# Patient Record
Sex: Female | Born: 1995 | Race: Black or African American | Hispanic: No | Marital: Single | Smoking: Never smoker
Health system: Southern US, Community
[De-identification: ages and names within clinical notes are randomized; demographics above are authoritative.]

## PROBLEM LIST (undated history)

## (undated) DIAGNOSIS — R51 Headache: Secondary | ICD-10-CM

## (undated) DIAGNOSIS — R519 Headache, unspecified: Secondary | ICD-10-CM

## (undated) DIAGNOSIS — D649 Anemia, unspecified: Secondary | ICD-10-CM

## (undated) DIAGNOSIS — M419 Scoliosis, unspecified: Secondary | ICD-10-CM

## (undated) DIAGNOSIS — Z789 Other specified health status: Secondary | ICD-10-CM

## (undated) HISTORY — PX: NO PAST SURGERIES: SHX2092

## (undated) HISTORY — PX: WISDOM TOOTH EXTRACTION: SHX21

---
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
## 2014-10-08 ENCOUNTER — Encounter (HOSPITAL_COMMUNITY): Payer: Self-pay | Admitting: *Deleted

## 2014-10-08 ENCOUNTER — Other Ambulatory Visit: Payer: Self-pay | Admitting: Obstetrics & Gynecology

## 2014-10-09 ENCOUNTER — Ambulatory Visit (HOSPITAL_COMMUNITY): Payer: 59

## 2014-10-09 ENCOUNTER — Encounter (HOSPITAL_COMMUNITY)
Admission: RE | Disposition: A | Discharge status: Home / Self Care | Payer: Self-pay | Source: Ambulatory Visit | Attending: Obstetrics & Gynecology

## 2014-10-09 ENCOUNTER — Ambulatory Visit (HOSPITAL_COMMUNITY): Payer: 59 | Admitting: Anesthesiology

## 2014-10-09 ENCOUNTER — Encounter (HOSPITAL_COMMUNITY): Payer: Self-pay

## 2014-10-09 ENCOUNTER — Ambulatory Visit (HOSPITAL_COMMUNITY)
Admission: RE | Admit: 2014-10-09 | Discharge: 2014-10-09 | Disposition: A | Discharge status: Home / Self Care | Payer: 59 | Source: Ambulatory Visit | Attending: Obstetrics & Gynecology | Admitting: Obstetrics & Gynecology

## 2014-10-09 DIAGNOSIS — Z3A15 15 weeks gestation of pregnancy: Secondary | ICD-10-CM | POA: Insufficient documentation

## 2014-10-09 DIAGNOSIS — Z91010 Allergy to peanuts: Secondary | ICD-10-CM | POA: Insufficient documentation

## 2014-10-09 DIAGNOSIS — Z332 Encounter for elective termination of pregnancy: Secondary | ICD-10-CM | POA: Diagnosis present

## 2014-10-09 DIAGNOSIS — R51 Headache: Secondary | ICD-10-CM | POA: Diagnosis not present

## 2014-10-09 DIAGNOSIS — M419 Scoliosis, unspecified: Secondary | ICD-10-CM | POA: Insufficient documentation

## 2014-10-09 DIAGNOSIS — D649 Anemia, unspecified: Secondary | ICD-10-CM | POA: Insufficient documentation

## 2014-10-09 HISTORY — DX: Headache, unspecified: R51.9

## 2014-10-09 HISTORY — DX: Headache: R51

## 2014-10-09 HISTORY — PX: DILATION AND EVACUATION: SHX1459

## 2014-10-09 HISTORY — DX: Anemia, unspecified: D64.9

## 2014-10-09 HISTORY — DX: Other specified health status: Z78.9

## 2014-10-09 LAB — CBC WITH DIFFERENTIAL/PLATELET
BASOS PCT: 0 % (ref 0–1)
Basophils Absolute: 0 10*3/uL (ref 0.0–0.1)
EOS ABS: 0.1 10*3/uL (ref 0.0–0.7)
EOS PCT: 1 % (ref 0–5)
HEMATOCRIT: 33.9 % — AB (ref 36.0–46.0)
Hemoglobin: 12.1 g/dL (ref 12.0–15.0)
Lymphocytes Relative: 36 % (ref 12–46)
Lymphs Abs: 2.1 10*3/uL (ref 0.7–4.0)
MCH: 31.3 pg (ref 26.0–34.0)
MCHC: 35.7 g/dL (ref 30.0–36.0)
MCV: 87.8 fL (ref 78.0–100.0)
MONOS PCT: 8 % (ref 3–12)
Monocytes Absolute: 0.5 10*3/uL (ref 0.1–1.0)
Neutro Abs: 3.2 10*3/uL (ref 1.7–7.7)
Neutrophils Relative %: 55 % (ref 43–77)
Platelets: 189 10*3/uL (ref 150–400)
RBC: 3.86 MIL/uL — ABNORMAL LOW (ref 3.87–5.11)
RDW: 12.4 % (ref 11.5–15.5)
WBC: 5.9 10*3/uL (ref 4.0–10.5)

## 2014-10-09 LAB — ABO/RH: ABO/RH(D): O POS

## 2014-10-09 SURGERY — DILATION AND EVACUATION, UTERUS, SECOND TRIMESTER
Anesthesia: General | Site: Uterus

## 2014-10-09 MED ORDER — GLYCOPYRROLATE 0.2 MG/ML IJ SOLN
INTRAMUSCULAR | Status: DC | PRN
  Administered 2014-10-09: 0.2 mg via INTRAVENOUS

## 2014-10-09 MED ORDER — PROPOFOL 10 MG/ML IV BOLUS
INTRAVENOUS | Status: DC | PRN
  Administered 2014-10-09: 200 mg via INTRAVENOUS

## 2014-10-09 MED ORDER — ONDANSETRON HCL 4 MG/2ML IJ SOLN
INTRAMUSCULAR | Status: DC | PRN
  Administered 2014-10-09: 4 mg via INTRAVENOUS

## 2014-10-09 MED ORDER — SCOPOLAMINE 1 MG/3DAYS TD PT72
MEDICATED_PATCH | TRANSDERMAL | Status: AC
  Administered 2014-10-09: 1.5 mg via TRANSDERMAL
  Filled 2014-10-09: qty 1

## 2014-10-09 MED ORDER — 0.9 % SODIUM CHLORIDE (POUR BTL) OPTIME
TOPICAL | Status: DC | PRN
  Administered 2014-10-09: 1000 mL

## 2014-10-09 MED ORDER — CEFAZOLIN SODIUM-DEXTROSE 2-3 GM-% IV SOLR
2.0000 g | INTRAVENOUS | Status: AC
  Administered 2014-10-09: 2 g via INTRAVENOUS

## 2014-10-09 MED ORDER — KETOROLAC TROMETHAMINE 30 MG/ML IJ SOLN
INTRAMUSCULAR | Status: DC | PRN
  Administered 2014-10-09: 30 mg via INTRAVENOUS

## 2014-10-09 MED ORDER — CEFAZOLIN SODIUM-DEXTROSE 2-3 GM-% IV SOLR
INTRAVENOUS | Status: AC
  Filled 2014-10-09: qty 50

## 2014-10-09 MED ORDER — ONDANSETRON HCL 4 MG/2ML IJ SOLN: 4.0000 mg | Freq: Once | INTRAMUSCULAR | Status: DC | PRN

## 2014-10-09 MED ORDER — SCOPOLAMINE 1 MG/3DAYS TD PT72
1.0000 | MEDICATED_PATCH | Freq: Once | TRANSDERMAL | Status: AC
  Administered 2014-10-09: 1.5 mg via TRANSDERMAL
  Administered 2014-10-09: 1 via TRANSDERMAL

## 2014-10-09 MED ORDER — PROPOFOL 10 MG/ML IV EMUL
INTRAVENOUS | Status: AC
  Filled 2014-10-09: qty 20

## 2014-10-09 MED ORDER — LACTATED RINGERS IV SOLN
INTRAVENOUS | Status: DC
  Administered 2014-10-09 (×2): via INTRAVENOUS

## 2014-10-09 MED ORDER — FENTANYL CITRATE 0.05 MG/ML IJ SOLN
INTRAMUSCULAR | Status: AC
  Filled 2014-10-09: qty 2

## 2014-10-09 MED ORDER — LIDOCAINE HCL 1 % IJ SOLN
INTRAMUSCULAR | Status: DC | PRN
  Administered 2014-10-09: 20 mL

## 2014-10-09 MED ORDER — MIDAZOLAM HCL 2 MG/2ML IJ SOLN
INTRAMUSCULAR | Status: DC | PRN
  Administered 2014-10-09: 2 mg via INTRAVENOUS

## 2014-10-09 MED ORDER — ONDANSETRON HCL 4 MG/2ML IJ SOLN
INTRAMUSCULAR | Status: AC
  Filled 2014-10-09: qty 2

## 2014-10-09 MED ORDER — MIDAZOLAM HCL 2 MG/2ML IJ SOLN
INTRAMUSCULAR | Status: AC
  Filled 2014-10-09: qty 2

## 2014-10-09 MED ORDER — FENTANYL CITRATE 0.05 MG/ML IJ SOLN
INTRAMUSCULAR | Status: DC | PRN
  Administered 2014-10-09 (×4): 25 ug via INTRAVENOUS

## 2014-10-09 MED ORDER — LIDOCAINE HCL (CARDIAC) 20 MG/ML IV SOLN
INTRAVENOUS | Status: DC | PRN
  Administered 2014-10-09: 50 mg via INTRAVENOUS

## 2014-10-09 MED ORDER — LIDOCAINE HCL 1 % IJ SOLN
INTRAMUSCULAR | Status: AC
  Filled 2014-10-09: qty 20

## 2014-10-09 MED ORDER — HYDROCODONE-IBUPROFEN 5-200 MG PO TABS: 1.0000 | ORAL_TABLET | Freq: Three times a day (TID) | ORAL | Status: DC | PRN

## 2014-10-09 MED ORDER — HYDROMORPHONE HCL 1 MG/ML IJ SOLN: 0.2500 mg | INTRAMUSCULAR | Status: DC | PRN

## 2014-10-09 MED ORDER — DEXAMETHASONE SODIUM PHOSPHATE 4 MG/ML IJ SOLN
INTRAMUSCULAR | Status: AC
  Filled 2014-10-09: qty 1

## 2014-10-09 MED ORDER — GLYCOPYRROLATE 0.2 MG/ML IJ SOLN
INTRAMUSCULAR | Status: AC
  Filled 2014-10-09: qty 1

## 2014-10-09 MED ORDER — KETOROLAC TROMETHAMINE 30 MG/ML IJ SOLN
INTRAMUSCULAR | Status: AC
  Filled 2014-10-09: qty 1

## 2014-10-09 MED ORDER — DEXAMETHASONE SODIUM PHOSPHATE 10 MG/ML IJ SOLN
INTRAMUSCULAR | Status: DC | PRN
  Administered 2014-10-09: 4 mg via INTRAVENOUS

## 2014-10-09 MED ORDER — LIDOCAINE HCL (CARDIAC) 20 MG/ML IV SOLN
INTRAVENOUS | Status: AC
  Filled 2014-10-09: qty 5

## 2014-10-09 MED ORDER — MEPERIDINE HCL 25 MG/ML IJ SOLN: 6.2500 mg | INTRAMUSCULAR | Status: DC | PRN

## 2014-10-09 SURGICAL SUPPLY — 18 items
CLOTH BEACON ORANGE TIMEOUT ST (SAFETY) ×3 IMPLANT
CONT PATH 16OZ SNAP LID 3702 (MISCELLANEOUS) ×3 IMPLANT
DRAPE STERI URO 9X17 APER PCH (DRAPES) IMPLANT
GLOVE BIO SURGEON STRL SZ 6.5 (GLOVE) ×3 IMPLANT
GLOVE BIO SURGEONS STRL SZ 6.5 (GLOVE) ×2
GLOVE BIOGEL PI IND STRL 7.0 (GLOVE) ×1 IMPLANT
GLOVE BIOGEL PI INDICATOR 7.0 (GLOVE) ×6
GOWN STRL REUS W/TWL LRG LVL3 (GOWN DISPOSABLE) ×6 IMPLANT
KIT BERKELEY 2ND TRIMESTER 1/2 (COLLECTOR) ×3 IMPLANT
PACK VAGINAL MINOR WOMEN LF (CUSTOM PROCEDURE TRAY) ×3 IMPLANT
PAD OB MATERNITY 4.3X12.25 (PERSONAL CARE ITEMS) ×3 IMPLANT
PAD PREP 24X48 CUFFED NSTRL (MISCELLANEOUS) ×3 IMPLANT
TOWEL OR 17X24 6PK STRL BLUE (TOWEL DISPOSABLE) ×6 IMPLANT
TUBE VACURETTE 2ND TRIMESTER (CANNULA) ×3 IMPLANT
VACURETTE 12 RIGID CVD (CANNULA) IMPLANT
VACURETTE 14MM CVD 1/2 BASE (CANNULA) ×2 IMPLANT
VACURETTE 16MM ASPIR CVD .5 (CANNULA) ×2 IMPLANT
WATER STERILE IRR 1000ML POUR (IV SOLUTION) ×3 IMPLANT

## 2014-10-09 NOTE — Addendum Note (Signed)
Addendum  created 10/09/14 1203 by Leilani AbleFranklin Lavarius Doughten, MD   Modules edited: Notes Section   Notes Section:  File: [id number]; Delete: [id number]

## 2014-10-09 NOTE — Anesthesia Postprocedure Evaluation (Deleted)
Anesthesia Post Note  Patient: Madeline GuernseyJessica T Reeves  Procedure(s) Performed: Procedure(s) (LRB): DILATATION AND EVACUATION (D&E) 2ND TRIMESTER (N/A)  Anesthesia type: Spinal  Patient location: PACU  Post pain: Pain level controlled  Post assessment: Post-op Vital signs reviewed  Last Vitals:  Filed Vitals:   10/09/14 1143  BP:   Pulse: 70  Temp:   Resp: 15    Post vital signs: Reviewed  Level of consciousness: awake  Complications: No apparent anesthesia complications

## 2014-10-09 NOTE — Transfer of Care (Signed)
Immediate Anesthesia Transfer of Care Note  Patient: Madeline GuernseyJessica T Reeves  Procedure(s) Performed: Procedure(s): DILATATION AND EVACUATION (D&E) 2ND TRIMESTER (N/A)  Patient Location: PACU  Anesthesia Type:General  Level of Consciousness: awake, alert  and oriented  Airway & Oxygen Therapy: Patient Spontanous Breathing and Patient connected to nasal cannula oxygen  Post-op Assessment: Report given to PACU RN, Post -op Vital signs reviewed and stable and Patient moving all extremities X 4  Post vital signs: Reviewed and stable  Complications: No apparent anesthesia complications

## 2014-10-09 NOTE — Anesthesia Postprocedure Evaluation (Signed)
Anesthesia Post Note  Patient: Madeline GuernseyJessica T Reeves  Procedure(s) Performed: Procedure(s) (LRB): DILATATION AND EVACUATION (D&E) 2ND TRIMESTER (N/A)  Anesthesia type: General  Patient location: PACU  Post pain: Pain level controlled  Post assessment: Post-op Vital signs reviewed  Last Vitals:  Filed Vitals:   10/09/14 1143  BP:   Pulse: 70  Temp:   Resp: 15    Post vital signs: Reviewed  Level of consciousness: sedated  Complications: No apparent anesthesia complications

## 2014-10-09 NOTE — Anesthesia Preprocedure Evaluation (Signed)
Anesthesia Evaluation  Patient identified by MRN, date of birth, ID band Patient awake    Reviewed: Allergy & Precautions, H&P , NPO status , Patient's Chart, lab work & pertinent test results  Airway Mallampati: I  TM Distance: >3 FB Neck ROM: full    Dental no notable dental hx. (+) Teeth Intact   Pulmonary neg pulmonary ROS,    Pulmonary exam normal       Cardiovascular negative cardio ROS      Neuro/Psych negative psych ROS   GI/Hepatic negative GI ROS, Neg liver ROS,   Endo/Other  negative endocrine ROS  Renal/GU negative Renal ROS     Musculoskeletal   Abdominal Normal abdominal exam  (+)   Peds  Hematology   Anesthesia Other Findings   Reproductive/Obstetrics negative OB ROS                             Anesthesia Physical Anesthesia Plan  ASA: II  Anesthesia Plan: General   Post-op Pain Management:    Induction: Intravenous  Airway Management Planned: LMA  Additional Equipment:   Intra-op Plan:   Post-operative Plan:   Informed Consent: I have reviewed the patients History and Physical, chart, labs and discussed the procedure including the risks, benefits and alternatives for the proposed anesthesia with the patient or authorized representative who has indicated his/her understanding and acceptance.     Plan Discussed with: CRNA and Surgeon  Anesthesia Plan Comments:         Anesthesia Quick Evaluation  

## 2014-10-09 NOTE — Op Note (Signed)
10/09/2014  10:08 AM  PATIENT:  Madeline Reeves  18 y.o. female  PRE-OPERATIVE DIAGNOSIS:  Undesired Pregnancy 15+ weeks  POST-OPERATIVE DIAGNOSIS:  Undesired Pregnancy 15+ weeks  PROCEDURE:  Procedure(s): DILATATION AND EVACUATION (D&E) 2ND TRIMESTER  SURGEON:  Surgeon(s): Genia DelMarie-Lyne Kiptyn Rafuse, MD  ASSISTANTS: none   ANESTHESIA:   general   PROCEDURE:  Under general anesthesia with endotracheal intubation the patient is in the lithotomy position. She is prepped with Betadine on the suprapubic, vulvar and vaginal areas. She is draped as usual. The vaginal exam reveals an anteverted uterus about 16 weeks, no adnexal mass.  The cervix is closed and long but softened by Cytotec. No vaginal bleeding.  The speculum was inserted in the vagina and the anterior lip of the cervix was grasped with a tenaculum. A paracervical block is done with lidocaine 1% a total of 20 cc at 4 and 8:00.  The dilation of the cervix with Hegar dilators up to #51 without difficulty. We used a #16 curved suction curette first and then a #14.  We used a #16 suction curette to rupture the membranes and suctioned the amniotic fluid.  The large fenestrated clamps were used to extract the products of conception.  We then went back with a #14 suction curette to complete the removal of the placenta under ultrasound guidance. A thin endometrial line without evidence of products of conception was confirmed at the end of the procedure.  All instruments were removed including the tenaculum of the cervix. Hemostasis was completed at that level with silver nitrate. The speculum was then removed. A bimanual exam was done with compression of the uterus.  The uterus was well contracted. Hemostasis was adequate. The patient was brought to recovery room in good and stable status.  ESTIMATED BLOOD LOSS: 500 cc   Intake/Output Summary (Last 24 hours) at 10/09/14 1008 Last data filed at 10/09/14 0930  Gross per 24 hour  Intake    500 ml   Output     20 ml  Net    480 ml     BLOOD ADMINISTERED:none   LOCAL MEDICATIONS USED:  LIDOCAINE   SPECIMEN:  Source of Specimen:  Products of conception  DISPOSITION OF SPECIMEN:  PATHOLOGY  COUNTS:  YES  PLAN OF CARE: Transfer to PACU  Genia DelMarie-Lyne Colden Samaras  MD  10/09/2014 at 10:08 am

## 2014-10-09 NOTE — Discharge Instructions (Addendum)
Dilation and Curettage or Vacuum Curettage, Care After °Refer to this sheet in the next few weeks. These instructions provide you with information on caring for yourself after your procedure. Your health care provider may also give you more specific instructions. Your treatment has been planned according to current medical practices, but problems sometimes occur. Call your health care provider if you have any problems or questions after your procedure. °WHAT TO EXPECT AFTER THE PROCEDURE °After your procedure, it is typical to have light cramping and bleeding. This may last for 2 days to 2 weeks after the procedure. °HOME CARE INSTRUCTIONS  °· Do not drive for 24 hours. °· Wait 1 week before returning to strenuous activities. °· Take your temperature 2 times a day for 4 days and write it down. Provide these temperatures to your health care provider if you develop a fever. °· Avoid long periods of standing. °· Avoid heavy lifting, pushing, or pulling. Do not lift anything heavier than 10 pounds (4.5 kg). °· Limit stair climbing to once or twice a day. °· Take rest periods often. °· You may resume your usual diet. °· Drink enough fluids to keep your urine clear or pale yellow. °· Your usual bowel function should return. If you have constipation, you may: °¨ Take a mild laxative with permission from your health care provider. °¨ Add fruit and bran to your diet. °¨ Drink more fluids. °· Take showers instead of baths until your health care provider gives you permission to take baths. °· Do not go swimming or use a hot tub until your health care provider approves. °· Try to have someone with you or available to you the first 24-48 hours, especially if you were given a general anesthetic. °· Do not douche, use tampons, or have sex (intercourse) for 2 weeks after the procedure. °· Only take over-the-counter or prescription medicines as directed by your health care provider. Do not take aspirin. It can cause  bleeding. °· Follow up with your health care provider as directed. °SEEK MEDICAL CARE IF:  °· You have increasing cramps or pain that is not relieved with medicine. °· You have abdominal pain that does not seem to be related to the same area of earlier cramping and pain. °· You have bad smelling vaginal discharge. °· You have a rash. °· You are having problems with any medicine. °SEEK IMMEDIATE MEDICAL CARE IF:  °· You have bleeding that is heavier than a normal menstrual period. °· You have a fever. °· You have chest pain. °· You have shortness of breath. °· You feel dizzy or feel like fainting. °· You pass out. °· You have pain in your shoulder strap area. °· You have heavy vaginal bleeding with or without blood clots. °MAKE SURE YOU:  °· Understand these instructions. °· Will watch your condition. °· Will get help right away if you are not doing well or get worse. °Document Released: 10/02/2000 Document Revised: 10/10/2013 Document Reviewed: 05/04/2013 °[employer]® Patient Information ©2015 [employer]. This information is not intended to replace advice given to you by your health care provider. Make sure you discuss any questions you have with your health care provider. °DISCHARGE INSTRUCTIONS: D&C / D&E °The following instructions have been prepared to help you care for yourself upon your return home. °  °Personal hygiene: °• Use sanitary pads for vaginal drainage, not tampons. °• Shower the day after your procedure. °• NO tub baths, pools or Jacuzzis for 2-3 weeks. °• Wipe front to back   after using the bathroom.  Activity and limitations:  Do NOT drive or operate any equipment for 24 hours. The effects of anesthesia are still present and drowsiness may result.  Do NOT rest in bed all day.  Walking is encouraged.  Walk up and down stairs slowly.  You may resume your normal activity in one to two days or as indicated by your physician.  Sexual activity: NO intercourse for at least 2 weeks after the  procedure, or as indicated by your physician.  Diet: Eat a light meal as desired this evening. You may resume your usual diet tomorrow.  Return to work: You may resume your work activities in one to two days or as indicated by your doctor.  What to expect after your surgery: Expect to have vaginal bleeding/discharge for 2-3 days and spotting for up to 10 days. It is not unusual to have soreness for up to 1-2 weeks. You may have a slight burning sensation when you urinate for the first day. Mild cramps may continue for a couple of days. You may have a regular period in 2-6 weeks.  Call your doctor for any of the following:  Excessive vaginal bleeding, saturating and changing one pad every hour.  Inability to urinate 6 hours after discharge from hospital.  Pain not relieved by pain medication.  Fever of 100.4 F or greater.  Unusual vaginal discharge or odor.   Call for an appointment:    Patients signature: ______________________  Nurses signature ________________________  Support person's signature_______________________   DISCHARGE INSTRUCTIONS: D&C / D&E The following instructions have been prepared to help you care for yourself upon your return home.   Personal hygiene:  Use sanitary pads for vaginal drainage, not tampons.  Shower the day after your procedure.  NO tub baths, pools or Jacuzzis for 2-3 weeks.  Wipe front to back after using the bathroom.  Activity and limitations:  Do NOT drive or operate any equipment for 24 hours. The effects of anesthesia are still present and drowsiness may result.  Do NOT rest in bed all day.  Walking is encouraged.  Walk up and down stairs slowly.  You may resume your normal activity in one to two days or as indicated by your physician.  Sexual activity: NO intercourse for at least 2 weeks after the procedure, or as indicated by your physician.  Diet: Eat a light meal as desired this evening. You may resume your usual  diet tomorrow.  Return to work: You may resume your work activities in one to two days or as indicated by your doctor.  What to expect after your surgery: Expect to have vaginal bleeding/discharge for 2-3 days and spotting for up to 10 days. It is not unusual to have soreness for up to 1-2 weeks. You may have a slight burning sensation when you urinate for the first day. Mild cramps may continue for a couple of days. You may have a regular period in 2-6 weeks.  Call your doctor for any of the following:  Excessive vaginal bleeding, saturating and changing one pad every hour.  Inability to urinate 6 hours after discharge from hospital.  Pain not relieved by pain medication.  Fever of 100.4 F or greater.  Unusual vaginal discharge or odor.   Call for an appointment:    Patients signature: ______________________  Nurses signature ________________________  Support person's signature_______________________

## 2014-10-09 NOTE — H&P (Signed)
Madeline Reeves is an 18 y.o. female  G1  RP:  Unwanted pregnancy at 15+ wks for D+Extraction  Pertinent Gynecological History: Contraception: OCP (estrogen/progesterone).  Pregnancy on BCPs, d/ced now. Blood transfusions: none Sexually transmitted diseases: no past history Previous GYN Procedures: None  Last pap: normal  OB History: G1   Menstrual History:  No LMP recorded.    Past Medical History  Diagnosis Date  . Scoliosis   . Medical history non-contributory     Past Surgical History  Procedure Laterality Date  . No past surgeries      History reviewed. No pertinent family history.  Social History:  reports that she has never smoked. She does not have any smokeless tobacco history on file. She reports that she does not drink alcohol or use illicit drugs.  Allergies:  Allergies  Allergen Reactions  . Peanut-Containing Drug Products Anaphylaxis    Meds:  Cytotec vaginally >6 hrs prior to procedure  ROS  There were no vitals taken for this visit. Physical Exam   Ob US WObGyn:  15+ wks  FHR present  No results found for this or any previous visit (from the past 24 hour(s)).  No results found.  Assessment/Plan: 15+ wks unplanned on BCPs, unwanted for D+Extraction under US guidance.  Surgery and risks reviewed thoroughly in office.  Madeline Reeves,MARIE-LYNE 10/09/2014, 6:36 AM

## 2014-10-09 NOTE — Discharge Summary (Signed)
  Physician Discharge Summary  Patient ID: Carolin GuernseyJessica T Jocelyn MRN: [id number] DOB/AGE: Sep 03, 1996 18 y.o.  Admit date: 10/09/2014 Discharge date: 10/09/2014  Admission Diagnoses: Undesired Pregnancy 15+ weeks  Discharge Diagnoses: Undesired Pregnancy 15+ weeks        Active Problems:   * No active hospital problems. *   Discharged Condition: good  Hospital Course: Outpatient  Consults: None  Treatments: surgery: Dilation and Extraction second trimester  Disposition: 01-Home or Self Care     Medication List    TAKE these medications        EPIPEN 0.3 mg/0.3 mL Devi  Generic drug:  EPINEPHrine  Inject 0.3 mg into the muscle once. For anaphylactic reactions     hydrocodone-ibuprofen 5-200 MG per tablet  Commonly known as:  VICOPROFEN  Take 1 tablet by mouth every 8 (eight) hours as needed for pain.     ibuprofen 200 MG tablet  Commonly known as:  ADVIL,MOTRIN  Take 400 mg by mouth every 6 (six) hours as needed.           Follow-up Information    Follow up with Cleofas Hudgins,MARIE-LYNE, MD In 3 weeks.   Specialty:  Obstetrics and Gynecology   Contact information:   [address] [place] [id number] [phone]       Signed: Genia DelLAVOIE,MARIE-LYNE, MD 10/09/2014, 10:17 AM

## 2014-10-12 ENCOUNTER — Encounter (HOSPITAL_COMMUNITY): Payer: Self-pay | Admitting: Obstetrics & Gynecology

## 2018-04-26 ENCOUNTER — Ambulatory Visit
Admission: RE | Admit: 2018-04-26 | Discharge: 2018-04-26 | Disposition: A | Discharge status: Home / Self Care | Payer: BLUE CROSS/BLUE SHIELD | Source: Ambulatory Visit | Attending: Sports Medicine | Admitting: Sports Medicine

## 2018-04-26 ENCOUNTER — Other Ambulatory Visit: Payer: Self-pay | Admitting: Sports Medicine

## 2018-04-26 ENCOUNTER — Ambulatory Visit (INDEPENDENT_AMBULATORY_CARE_PROVIDER_SITE_OTHER): Payer: BLUE CROSS/BLUE SHIELD | Admitting: Sports Medicine

## 2018-04-26 VITALS — BP 110/78 | Ht 63.5 in | Wt 171.0 lb

## 2018-04-26 DIAGNOSIS — M25551 Pain in right hip: Secondary | ICD-10-CM

## 2018-04-27 ENCOUNTER — Encounter: Payer: Self-pay | Admitting: Sports Medicine

## 2018-04-27 NOTE — Addendum Note (Signed)
Addended by: Rutha BouchardBABNIK, Lawren Sexson E on: 04/27/2018 10:31 AM   Modules accepted: Orders

## 2018-04-27 NOTE — Progress Notes (Signed)
   Subjective:    Patient ID: Madeline Reeves, female    DOB: 12-21-1995, 22 y.o.   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 She has been increasing her running in an attempt to get in shape. She denies any trauma. She localizes the pain to the anterior hip, specifically the proximal femur with radiating pain into her groin. Pain is to the point now that she is having to limp. She denies problems with this hip in the past but does have a history of previous tibial stress fractures. She stopped training about a week ago. She has pain with walking and prolonged standing. Denies numbness or tingling. No pain in the left hip. No pain at rest.  Past history reviewed Medications reviewed Allergies reviewed    Review of Systems As above    Objective:   Physical Exam  Well-developed, well-nourished. No acute distress. Awake alert and oriented 3. Vital signs reviewed  Right hip: Patient has smooth painless hip range of motion with a negative logroll. Negative FADIR. Positive fulcrum test. Positive hop test. No tenderness to palpation. No soft tissue swelling. Neurovascular intact distally. Walking with a slight limp.  X-ray of her right hip and femur show no obvious stress fracture      Assessment & Plan:   Right hip pain worrisome for femoral neck stress fracture versus proximal femoral shaft stress fracture  X-rays are unremarkable. Patient has a history of previous stress fractures and history and physical exam findings today suggest a new stress fracture in her hip. Therefore, I will get an MRI scan of her right hip with instructions to open the field to include the proximal third of the femur. Phone follow-up with those results when available. We'll delineate further treatment based on those findings. In the meantime, patient is given crutches to assist with ambulation and  instructed not to run. She's also given a work note asking for sitting duty only until MRI results are known.

## 2018-04-30 ENCOUNTER — Ambulatory Visit
Admission: RE | Admit: 2018-04-30 | Discharge: 2018-04-30 | Disposition: A | Discharge status: Home / Self Care | Payer: BLUE CROSS/BLUE SHIELD | Source: Ambulatory Visit | Attending: Sports Medicine | Admitting: Sports Medicine

## 2018-04-30 DIAGNOSIS — M25551 Pain in right hip: Secondary | ICD-10-CM

## 2018-05-03 ENCOUNTER — Telehealth: Payer: Self-pay | Admitting: Sports Medicine

## 2018-05-03 NOTE — Telephone Encounter (Signed)
I spoke with the patient on the phone today after reviewing the MRI of her right hip. There is no evidence of stress fracture. She may wean from her crutches as her pain allows.Follow-up with me in 3 weeks for reevaluation and a gait analysis. No running in the meantime.

## 2018-05-31 ENCOUNTER — Ambulatory Visit (INDEPENDENT_AMBULATORY_CARE_PROVIDER_SITE_OTHER): Payer: BLUE CROSS/BLUE SHIELD | Admitting: Sports Medicine

## 2018-05-31 ENCOUNTER — Encounter: Payer: Self-pay | Admitting: Sports Medicine

## 2018-05-31 VITALS — BP 100/62 | Ht 63.5 in | Wt 165.0 lb

## 2018-05-31 DIAGNOSIS — M25551 Pain in right hip: Secondary | ICD-10-CM

## 2018-06-01 ENCOUNTER — Encounter: Payer: Self-pay | Admitting: Sports Medicine
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
## 2018-07-06 ENCOUNTER — Emergency Department (HOSPITAL_COMMUNITY): Payer: BLUE CROSS/BLUE SHIELD

## 2018-07-06 ENCOUNTER — Emergency Department (HOSPITAL_COMMUNITY)
Admission: EM | Admit: 2018-07-06 | Discharge: 2018-07-07 | Disposition: A | Discharge status: Home / Self Care | Payer: BLUE CROSS/BLUE SHIELD | Attending: Emergency Medicine | Admitting: Emergency Medicine

## 2018-07-06 ENCOUNTER — Other Ambulatory Visit: Payer: Self-pay

## 2018-07-06 DIAGNOSIS — Y999 Unspecified external cause status: Secondary | ICD-10-CM | POA: Insufficient documentation

## 2018-07-06 DIAGNOSIS — S20212A Contusion of left front wall of thorax, initial encounter: Secondary | ICD-10-CM | POA: Diagnosis not present

## 2018-07-06 DIAGNOSIS — Y9389 Activity, other specified: Secondary | ICD-10-CM | POA: Diagnosis not present

## 2018-07-06 DIAGNOSIS — S161XXA Strain of muscle, fascia and tendon at neck level, initial encounter: Secondary | ICD-10-CM | POA: Diagnosis not present

## 2018-07-06 DIAGNOSIS — Z91010 Allergy to peanuts: Secondary | ICD-10-CM | POA: Diagnosis not present

## 2018-07-06 DIAGNOSIS — Y92410 Unspecified street and highway as the place of occurrence of the external cause: Secondary | ICD-10-CM | POA: Insufficient documentation

## 2018-07-06 DIAGNOSIS — S199XXA Unspecified injury of neck, initial encounter: Secondary | ICD-10-CM | POA: Diagnosis present

## 2018-07-06 MED ORDER — METHOCARBAMOL 500 MG PO TABS
1000.0000 mg | ORAL_TABLET | Freq: Once | ORAL | Status: AC
  Administered 2018-07-06: 1000 mg via ORAL
  Filled 2018-07-06: qty 2

## 2018-07-06 MED ORDER — HYDROCODONE-ACETAMINOPHEN 5-325 MG PO TABS
1.0000 | ORAL_TABLET | Freq: Four times a day (QID) | ORAL | 0 refills | Status: DC | PRN
Start: 2018-07-06 — End: 2018-07-18

## 2018-07-06 MED ORDER — IBUPROFEN 400 MG PO TABS
600.0000 mg | ORAL_TABLET | Freq: Once | ORAL | Status: AC
  Administered 2018-07-06: 600 mg via ORAL
  Filled 2018-07-06: qty 1

## 2018-07-06 MED ORDER — LIDOCAINE 5 % EX PTCH: 1.0000 | MEDICATED_PATCH | CUTANEOUS | 0 refills | Status: DC

## 2018-07-06 MED ORDER — METHOCARBAMOL 500 MG PO TABS: 500.0000 mg | ORAL_TABLET | Freq: Two times a day (BID) | ORAL | 0 refills | Status: DC
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
## 2018-07-06 NOTE — Discharge Instructions (Addendum)
There was evidence of straightening in the cervical spine.  This could have been due to positioning or muscle spasms, but could also represent an injury to the ligaments.  Expect your soreness to increase over the next 2-3 days. Take it easy, but do not lay around too much as this may make any stiffness worse.  Antiinflammatory medications: Take 600 mg of ibuprofen every 6 hours or 440 mg (over the counter dose) to 500 mg (prescription dose) of naproxen every 12 hours for the next 3 days. After this time, these medications may be used as needed for pain. Take these medications with food to avoid upset stomach. Choose only one of these medications, do not take them together. Acetaminophen (generic for Tylenol): Should you continue to have additional pain while taking the ibuprofen or naproxen, you may add in acetaminophen as needed. Your daily total maximum amount of acetaminophen from all sources should be limited to 4000mg /day for persons without liver problems, or 2000mg /day for those with liver problems. Muscle relaxer: Robaxin is a muscle relaxer and may help loosen stiff muscles. Do not take the Robaxin while driving or performing other dangerous activities.  Lidocaine patches: These are available via either prescription or over-the-counter. The over-the-counter option may be more economical one and are likely just as effective. There are multiple over-the-counter brands, such as Salonpas. Exercises: Be sure to perform the attached exercises starting with three times a week and working up to performing them daily. This is an essential part of preventing long term problems.  Follow up: Follow up with a primary care provider for any future management of these complaints. Be sure to follow up within 7-10 days. Return: Return to the ED should symptoms worsen.
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
## 2018-07-07 NOTE — ED Notes (Signed)
Patient verbalizes understanding of discharge instructions. Opportunity for questioning and answers were provided. Armband removed by staff, pt discharged from ED in wheelchair.  

## 2018-07-11 ENCOUNTER — Encounter: Payer: Self-pay | Admitting: Sports Medicine

## 2018-07-11 ENCOUNTER — Ambulatory Visit (INDEPENDENT_AMBULATORY_CARE_PROVIDER_SITE_OTHER): Payer: Self-pay | Admitting: Sports Medicine

## 2018-07-11 VITALS — BP 110/72 | Ht 63.5 in | Wt 166.0 lb

## 2018-07-11 DIAGNOSIS — S060X0A Concussion without loss of consciousness, initial encounter: Secondary | ICD-10-CM

## 2018-07-11 DIAGNOSIS — S161XXA Strain of muscle, fascia and tendon at neck level, initial encounter: Secondary | ICD-10-CM

## 2018-07-11 MED ORDER — IBUPROFEN 800 MG PO TABS: ORAL_TABLET | ORAL | 1 refills | Status: AC
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
## 2018-07-13 ENCOUNTER — Encounter: Payer: Self-pay | Admitting: *Deleted

## 2018-07-18 ENCOUNTER — Ambulatory Visit (INDEPENDENT_AMBULATORY_CARE_PROVIDER_SITE_OTHER): Payer: BLUE CROSS/BLUE SHIELD | Admitting: Sports Medicine

## 2018-07-18 VITALS — BP 116/72 | Ht 63.5 in | Wt 166.0 lb

## 2018-07-18 DIAGNOSIS — S060X0A Concussion without loss of consciousness, initial encounter: Secondary | ICD-10-CM
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
## 2018-11-22 ENCOUNTER — Ambulatory Visit (INDEPENDENT_AMBULATORY_CARE_PROVIDER_SITE_OTHER): Payer: BLUE CROSS/BLUE SHIELD | Admitting: Student in an Organized Health Care Education/Training Program

## 2018-11-22 VITALS — BP 112/64 | HR 68 | Temp 98.9°F | Ht 63.5 in | Wt 164.6 lb

## 2018-11-22 DIAGNOSIS — I889 Nonspecific lymphadenitis, unspecified: Secondary | ICD-10-CM

## 2018-11-22 MED ORDER — AMOXICILLIN-POT CLAVULANATE 875-125 MG PO TABS: 1.0000 | ORAL_TABLET | Freq: Two times a day (BID) | ORAL | 0 refills | Status: AC

## 2018-11-22 NOTE — Patient Instructions (Signed)
It was a pleasure seeing you today in our clinic.  Here is the treatment plan we have discussed and agreed upon together:  We drew blood work at today's visit. I will call or send you a letter with these results. If you do not hear from me within the next week, please give our office a call.  Please schedule follow up to be seen on Friday either in our office or with your PCP.  Our clinic's number is [phone]. Please call with questions or concerns about what we discussed today.  Be well, Dr. Mosetta Putt

## 2018-11-22 NOTE — Assessment & Plan Note (Addendum)
Presentation is most consistent with lymphadenitis. She has fluid behind the R tympanic membrane but no apparent infection. She has discomfort with opening her mouth widely but no vocal changes, no difficulty breathing, no drooling. 1/5 centor criteria so no strep swab completed. - amoxicillin-clavulanate (AUGMENTIN) 875-125 MG tablet; Take 1 tablet by mouth 2 (two) times daily.  Dispense: 20 tablet; Refill: 0 - HIV Antibody (routine testing w rflx) - CBC - Comprehensive metabolic panel - Patient to schedule follow up either in our clinic or with her PCP on Friday - strict return cautions and red flags were discussed

## 2018-11-22 NOTE — Progress Notes (Signed)
   CC: Right neck fullness  HPI: Madeline Reeves is a 23 y.o. female, previously healthy, presenting for right neck fullness.  Patient is not a regular patient of the family medicine center, however was sent over from sports medicine for evaluation of right neck fullness.  She reports that she has been having a cough for 2 weeks productive of clear mucus.  She is now developing difficulty swallowing.  She denies any fevers.  She does have some right ear pain that radiates up to her head.  She has noticed fullness in her right neck.  Denies any difficulty breathing.  She is not having any changes in her voice.  She is able to turn her head side to side without difficulty but does have some pain on opening her mouth wide. Tooth pain developed over the past few days.  She does not have a cat. She denies tobacco or injectable drug use.   Review of Symptoms:  See HPI for ROS.   CC, SH/smoking status, and VS noted.  Objective: BP 112/64   Pulse 68   Temp 98.9 F (37.2 C) (Oral)   Ht 5' 3.5" (1.613 m)   Wt 164 lb 9.6 oz (74.7 kg)   SpO2 98%   BMI 28.70 kg/m  GEN: NAD, alert, cooperative, and pleasant. EYE: no conjunctival injection, pupils equally round and reactive to light ENMT: +R ear effusion without erythema, no pharyngeal exudate, +mild pharyngeal erythema  NECK: normal ROM side to side with hesitancy due to right neck pain. +R neck mass is tender to palpation, poorly circumscribed, no overlying erythema, some overlying warmth RESPIRATORY: clear to auscultation bilaterally with no wheezes, rhonchi or rales, good effort CV: RRR, no m/r/g GI: soft, non-tender, non-distended, no hepatosplenomegaly SKIN: warm and dry, no rashes or lesions NEURO: II-XII grossly intact PSYCH: AAOx3, appropriate affect  Assessment and plan:  Lymphadenitis Presentation is most consistent with lymphadenitis. She has fluid behind the R tympanic membrane but no apparent infection. She has discomfort with  opening her mouth widely but no vocal changes, no difficulty breathing, no drooling. 1/5 centor criteria so no strep swab completed. - amoxicillin-clavulanate (AUGMENTIN) 875-125 MG tablet; Take 1 tablet by mouth 2 (two) times daily.  Dispense: 20 tablet; Refill: 0 - HIV Antibody (routine testing w rflx) - CBC - Comprehensive metabolic panel - Patient to schedule follow up either in our clinic or with her PCP on Friday - strict return cautions and red flags were discussed   Orders Placed This Encounter  Procedures  . HIV Antibody (routine testing w rflx)  . CBC  . Comprehensive metabolic panel    Order Specific Question:   Has the patient fasted?    Answer:   No    Meds ordered this encounter  Medications  . amoxicillin-clavulanate (AUGMENTIN) 875-125 MG tablet    Sig: Take 1 tablet by mouth 2 (two) times daily.    Dispense:  20 tablet    Refill:  0    Howard Pouch, MD,MS,  PGY3 11/22/2018 4:38 PM

## 2018-11-23 LAB — COMPREHENSIVE METABOLIC PANEL
A/G RATIO: 1.6 (ref 1.2–2.2)
ALK PHOS: 38 IU/L — AB (ref 39–117)
ALT: 14 IU/L (ref 0–32)
AST: 17 IU/L (ref 0–40)
Albumin: 4.5 g/dL (ref 3.9–5.0)
BUN/Creatinine Ratio: 12 (ref 9–23)
BUN: 10 mg/dL (ref 6–20)
Bilirubin Total: <0.2 mg/dL (ref 0.0–1.2)
CHLORIDE: 106 mmol/L (ref 96–106)
CO2: 20 mmol/L (ref 20–29)
Calcium: 9.4 mg/dL (ref 8.7–10.2)
Creatinine, Ser: 0.86 mg/dL (ref 0.57–1.00)
GFR calc Af Amer: 110 mL/min/{1.73_m2} (ref >59)
GFR calc non Af Amer: 96 mL/min/{1.73_m2} (ref >59)
Globulin, Total: 2.8 g/dL (ref 1.5–4.5)
Glucose: 103 mg/dL — ABNORMAL HIGH (ref 65–99)
Potassium: 4.2 mmol/L (ref 3.5–5.2)
SODIUM: 140 mmol/L (ref 134–144)
Total Protein: 7.3 g/dL (ref 6.0–8.5)

## 2018-11-23 LAB — CBC
Hematocrit: 37.3 % (ref 34.0–46.6)
Hemoglobin: 12.9 g/dL (ref 11.1–15.9)
MCH: 31.1 pg (ref 26.6–33.0)
MCHC: 34.6 g/dL (ref 31.5–35.7)
MCV: 90 fL (ref 79–97)
Platelets: 226 10*3/uL (ref 150–450)
RBC: 4.15 x10E6/uL (ref 3.77–5.28)
RDW: 12.4 % (ref 11.7–15.4)
WBC: 5.8 10*3/uL (ref 3.4–10.8)

## 2018-11-23 LAB — HIV ANTIBODY (ROUTINE TESTING W REFLEX): HIV Screen 4th Generation wRfx: NONREACTIVE

## 2018-11-24 ENCOUNTER — Ambulatory Visit (INDEPENDENT_AMBULATORY_CARE_PROVIDER_SITE_OTHER): Payer: BLUE CROSS/BLUE SHIELD | Admitting: Family Medicine

## 2018-11-24 ENCOUNTER — Other Ambulatory Visit: Payer: Self-pay

## 2018-11-24 ENCOUNTER — Encounter: Payer: Self-pay | Admitting: Family Medicine

## 2018-11-24 VITALS — BP 98/58 | HR 65 | Temp 98.5°F | Wt 167.4 lb

## 2018-11-24 DIAGNOSIS — I889 Nonspecific lymphadenitis, unspecified: Secondary | ICD-10-CM

## 2018-11-24 DIAGNOSIS — Z7689 Persons encountering health services in other specified circumstances: Secondary | ICD-10-CM
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
## 2018-11-24 NOTE — Patient Instructions (Addendum)
Dear Madeline Reeves,   It was nice to meet you today! I am glad you came in for your concerns. This document serves as a "wrap-up" to all that we discussed today and is listed as follows:   If you start to have any fever or feel worse, please call the office to make an appointment to be seen.   Thank you for choosing [facility] for your primary care needs and stay well!   Best,   Dr. Genia Hotterachel Tashi Band Resident Physician, PGY-1 [facility] [phone]    Don't forget to sign up for MyChart for instant access to your health profile, labs, orders, upcoming appointments or to contact your provider with questions. Stop at the front desk on the way out for more information about how to sign up!   Lymphadenopathy  Lymphadenopathy means that your lymph glands are swollen or larger than normal (enlarged). Lymph glands, also called lymph nodes, are collections of tissue that filter bacteria, viruses, and waste from your bloodstream. They are part of your body's disease-fighting system (immune system), which protects your body from germs. There may be different causes of lymphadenopathy, depending on where it is in your body. Some types go away on their own. Lymphadenopathy can occur anywhere that you have lymph glands, including these areas:  Neck (cervical lymphadenopathy).  Chest (mediastinal lymphadenopathy).  Lungs (hilar lymphadenopathy).  Underarms (axillary lymphadenopathy).  Groin (inguinal lymphadenopathy). When your immune system responds to germs, infection-fighting cells and fluid build up in your lymph glands. This causes some swelling and enlargement. If the lymph glands do not go back to normal after you have an infection or disease, your health care provider may do tests. These tests help to monitor your condition and find the reason why the glands are still swollen and enlarged. Follow these instructions at home:  Get plenty of rest.  Take  over-the-counter and prescription medicines only as told by your health care provider. Your health care provider may recommend over-the-counter medicines for pain.  If directed, apply heat to swollen lymph glands as often as told by your health care provider. Use the heat source that your health care provider recommends, such as a moist heat pack or a heating pad. ? Place a towel between your skin and the heat source. ? Leave the heat on for 20-30 minutes. ? Remove the heat if your skin turns bright red. This is especially important if you are unable to feel pain, heat, or cold. You may have a greater risk of getting burned.  Check your affected lymph glands every day for changes. Check other lymph gland areas as told by your health care provider. Check for changes such as: ? More swelling. ? Sudden increase in size. ? Redness or pain. ? Hardness.  Keep all follow-up visits as told by your health care provider. This is important. Contact a health care provider if you have:  Swelling that gets worse or spreads to other areas.  Problems with breathing.  Lymph glands that: ? Are still swollen after 2 weeks. ? Have suddenly gotten bigger. ? Are red, painful, or hard.  A fever or chills.  Fatigue.  A sore throat.  Pain in your abdomen.  Weight loss.  Night sweats. Get help right away if you have:  Fluid leaking from an enlarged lymph gland.  Severe pain.  Chest pain.  Shortness of breath. Summary  Lymphadenopathy means that your lymph glands are swollen or larger than normal (enlarged).  Lymph glands (also called lymph nodes) are collections of tissue that filter bacteria, viruses, and waste from the bloodstream. They are part of your body's disease-fighting system (immune system).  Lymphadenopathy can occur anywhere that you have lymph glands.  If your enlarged and swollen lymph glands do not go back to normal after you have an infection or disease, your health care  provider may do tests to monitor your condition and find the reason why the glands are still swollen and enlarged.  Check your affected lymph glands every day for changes. Check other lymph gland areas as told by your health care provider. This information is not intended to replace advice given to you by your health care provider. Make sure you discuss any questions you have with your health care provider. Document Released: 07/14/2008 Document Revised: 08/20/2017 Document Reviewed: 08/20/2017 Elsevier Interactive Patient Education  2019 [employer]

## 2018-12-01 ENCOUNTER — Encounter: Payer: Self-pay | Admitting: Family Medicine

## 2018-12-01 DIAGNOSIS — Z7689 Persons encountering health services in other specified circumstances: Secondary | ICD-10-CM | POA: Insufficient documentation

## 2018-12-01 NOTE — Assessment & Plan Note (Signed)
Improving with lessened pain. No red flag symptoms. No strep necessary  - continue augmentin for 10 day course - return precautions given
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
## 2018-12-29 ENCOUNTER — Other Ambulatory Visit: Payer: Self-pay | Admitting: Obstetrics and Gynecology

## 2018-12-29 DIAGNOSIS — N6452 Nipple discharge: Secondary | ICD-10-CM

## 2019-10-17 ENCOUNTER — Ambulatory Visit: Payer: BC Managed Care – PPO | Attending: Internal Medicine

## 2019-10-17 DIAGNOSIS — U071 COVID-19: Secondary | ICD-10-CM

## 2019-10-17 DIAGNOSIS — R238 Other skin changes: Secondary | ICD-10-CM

## 2019-10-18 LAB — NOVEL CORONAVIRUS, NAA: SARS-CoV-2, NAA: NOT DETECTED

## 2020-01-30 IMAGING — MR MR HIP*R* W/O CM
5 series · 37 of 40 positions shown · non-contrast
Comparison: Right femur x-rays dated April 26, 2018.

CLINICAL DATA: Anterior and lateral hip pain for the past month.

EXAM:
MR OF THE RIGHT HIP WITHOUT CONTRAST
TECHNIQUE: Multiplanar, multisequence MR imaging was performed. No intravenous
contrast was administered.

[Series 4: T1 · coronal · 4.0mm · 1.09mm/px · 9 of 30 slices shown]
[im 1/30]
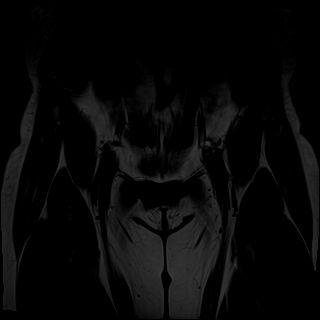
[im 4/30]
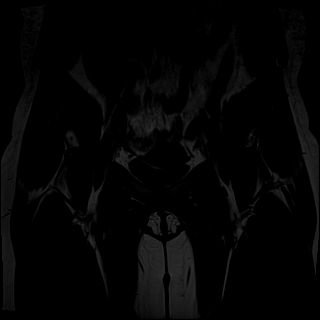
[im 8/30]
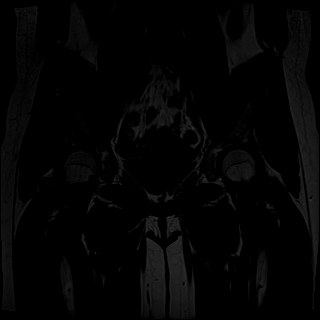
[im 11/30]
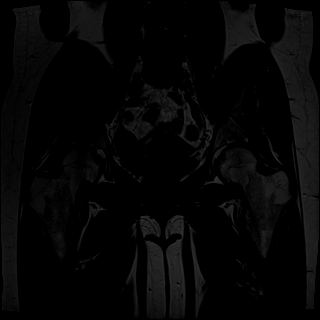
[im 15/30]
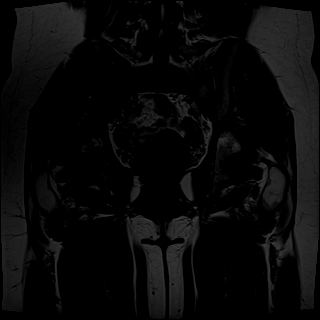
[im 19/30]
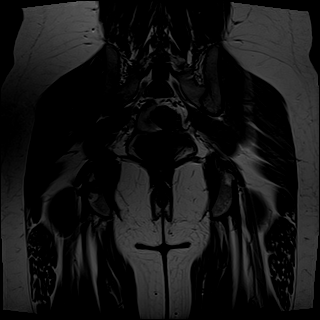
[im 22/30]
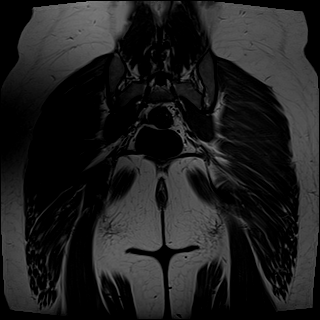
[im 26/30]
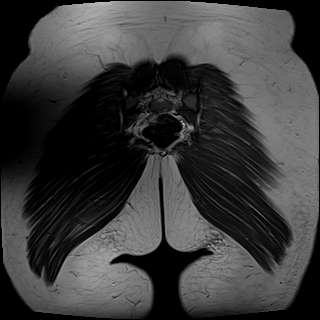
[im 30/30]
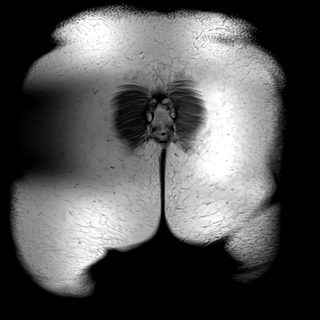

[Series 5: T2 fat-sat · coronal · 4.0mm · 1.09mm/px · 9 of 30 slices shown (1 of 2)]
[im 1/30]
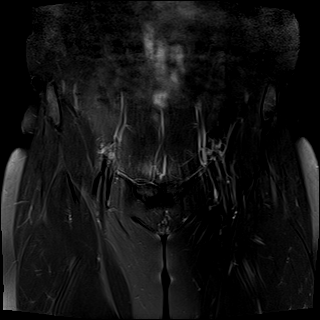
[im 4/30]
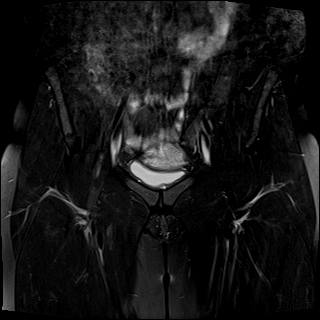
[im 8/30]
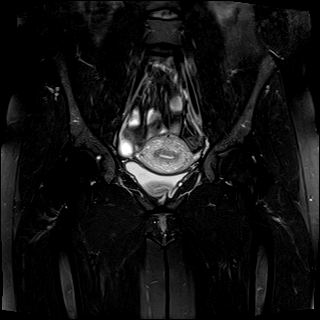
[im 11/30]
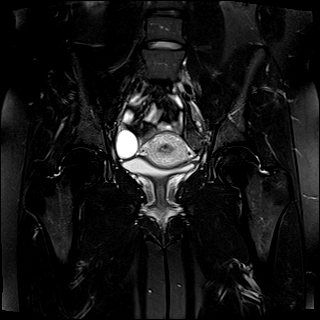
[im 15/30]
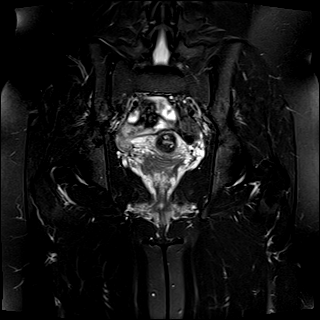
[im 19/30]
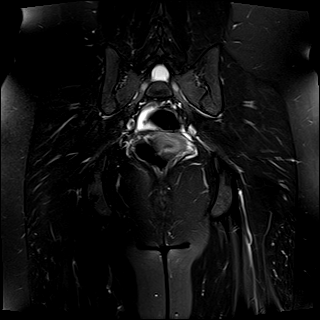
[im 22/30]
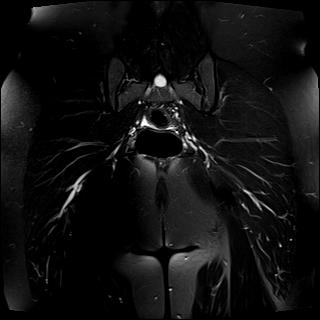
[im 26/30]
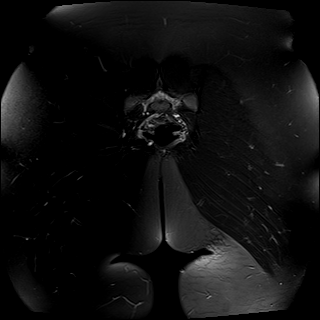
[im 30/30]
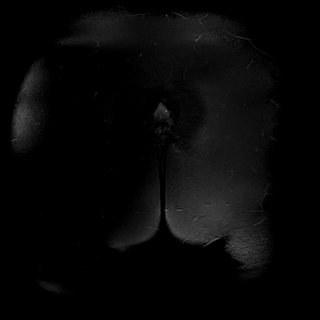

[Series 6: T2 fat-sat · axial · 4.0mm · 0.35mm/px · z∈[-61,+94]mm · 9 of 32 slices shown (2 of 2)]
[im 1/32]
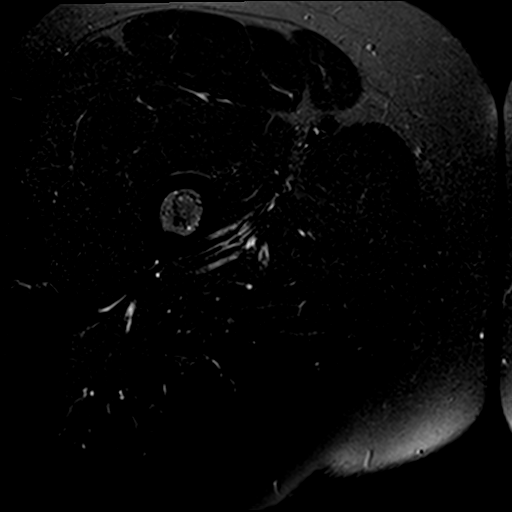
[im 4/32]
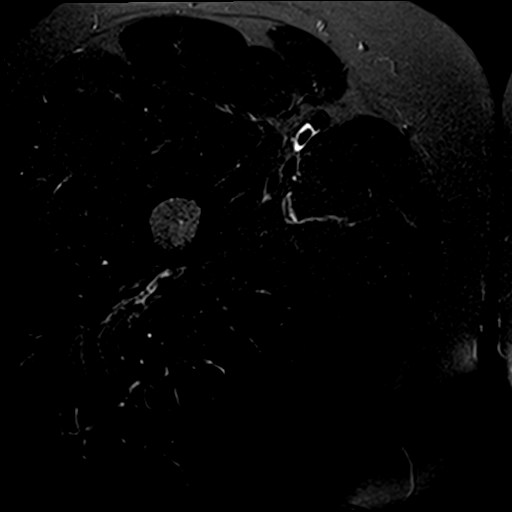
[im 8/32]
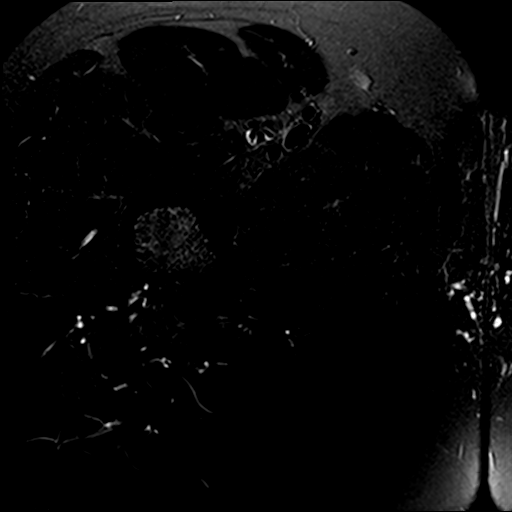
[im 12/32]
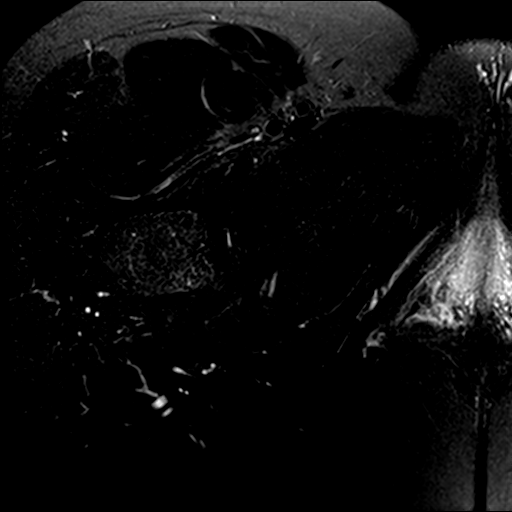
[im 16/32]
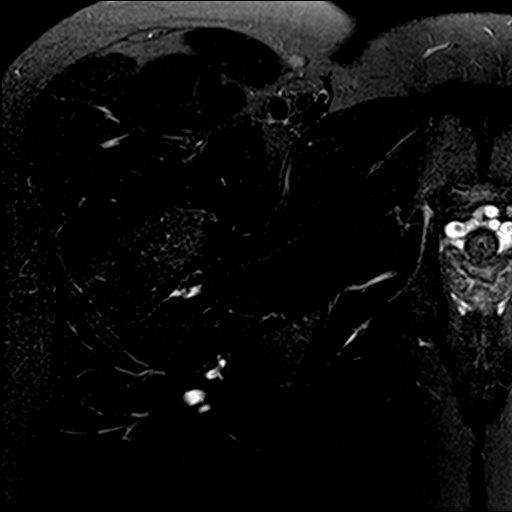
[im 20/32]
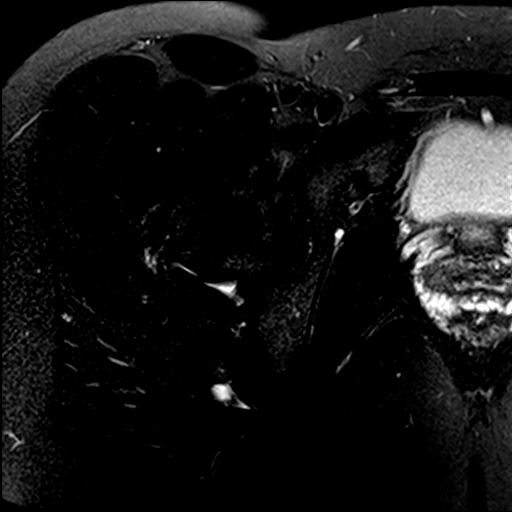
[im 24/32]
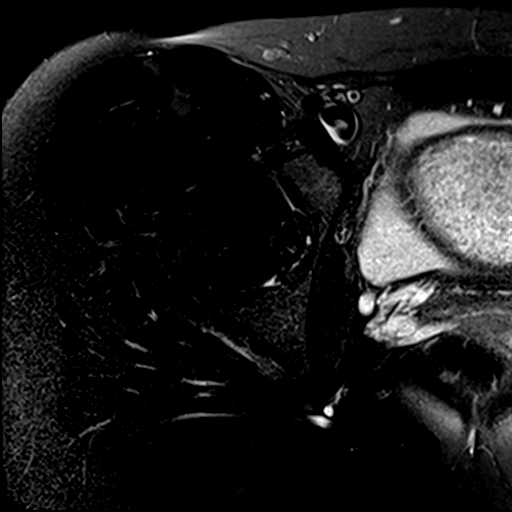
[im 28/32]
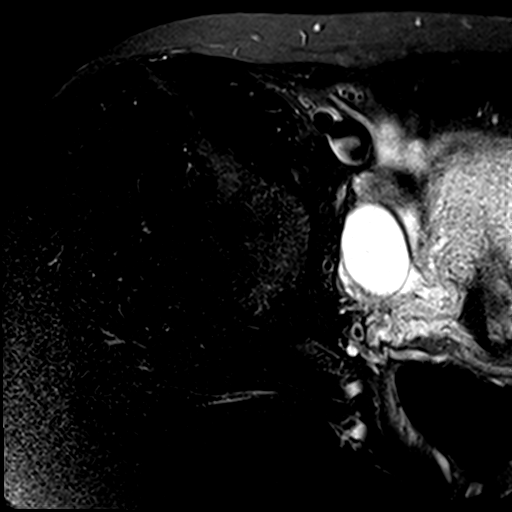
[im 32/32]
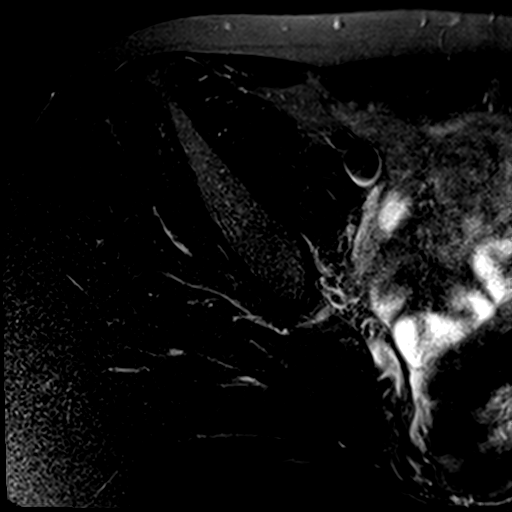

[Series 7: PD fat-sat · sagittal · 4.0mm · 0.78mm/px · 7 of 24 slices shown (1 of 2)]
[im 1/24]
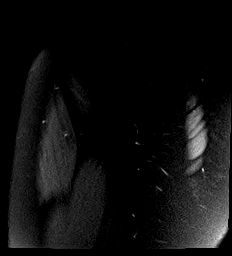
[im 4/24]
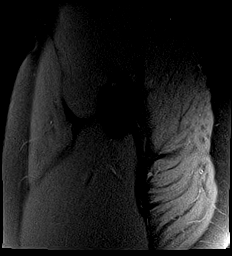
[im 8/24]
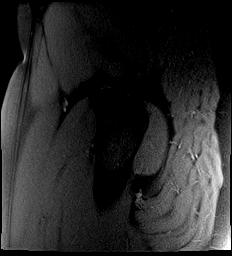
[im 12/24]
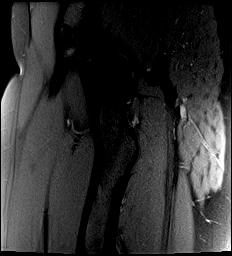
[im 16/24]
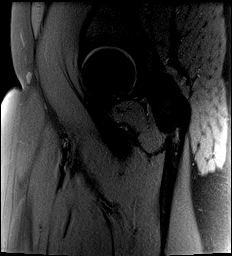
[im 20/24]
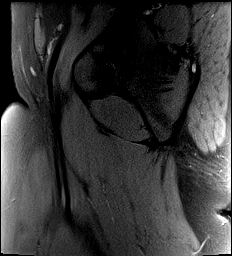
[im 24/24]
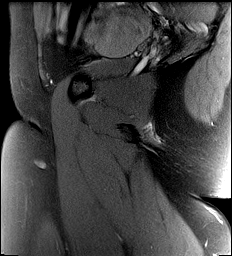

[Series 8: PD fat-sat · coronal · 4.0mm · 0.78mm/px · 3 of 19 slices shown (2 of 2)]
[im 1/19]
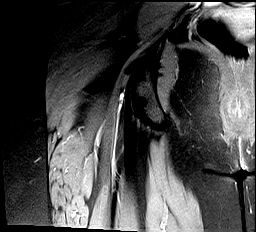
[im 4/19]
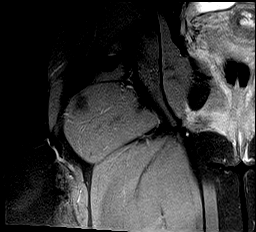
[im 8/19]
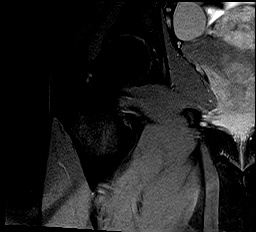

[37 of 40 positions shown; findings below may reference images not displayed]

FINDINGS: Bones: There is no evidence of acute fracture, dislocation or
avascular necrosis. The visualized bony pelvis appears normal. The
visualized sacroiliac joints and symphysis pubis appear normal.

Articular cartilage and labrum

Articular cartilage: No focal chondral defect or subchondral signal
abnormality identified.

Labrum: There is no gross labral tear or paralabral abnormality.

Joint or bursal effusion

Joint effusion: No significant hip joint effusion.

Bursae: No focal periarticular fluid collection.

Muscles and tendons

Muscles and tendons: The visualized gluteus, hamstring and iliopsoas
tendons appear normal. The piriformis muscles appear symmetric.

Other findings

Miscellaneous: 3.3 cm simple cyst in the right ovary. The visualized
internal pelvic contents otherwise appear unremarkable.
IMPRESSION: 1. Normal MRI of the right hip. No explanation for the patient's
symptoms.

## 2021-05-06 ENCOUNTER — Other Ambulatory Visit (HOSPITAL_COMMUNITY): Payer: Self-pay
